# Patient Record
Sex: Female | Born: 1970 | Race: White | Hispanic: No | Smoking: Former smoker
Health system: Southern US, Community
[De-identification: ages and names within clinical notes are randomized; demographics above are authoritative.]

## PROBLEM LIST (undated history)

## (undated) DIAGNOSIS — C801 Malignant (primary) neoplasm, unspecified: Secondary | ICD-10-CM

## (undated) DIAGNOSIS — H409 Unspecified glaucoma: Secondary | ICD-10-CM

## (undated) DIAGNOSIS — R000 Tachycardia, unspecified: Secondary | ICD-10-CM

## (undated) DIAGNOSIS — I10 Essential (primary) hypertension: Secondary | ICD-10-CM

## (undated) DIAGNOSIS — D219 Benign neoplasm of connective and other soft tissue, unspecified: Secondary | ICD-10-CM

## (undated) HISTORY — PX: WISDOM TOOTH EXTRACTION: SHX21

## (undated) HISTORY — PX: DILATION AND CURETTAGE OF UTERUS: SHX78

---

## 1994-02-16 HISTORY — PX: CERVICAL CONIZATION W/BX: SHX1330

## 2009-03-11 ENCOUNTER — Encounter: Admission: RE | Admit: 2009-03-11 | Discharge: 2009-03-11 | Payer: Self-pay | Admitting: Neurology

## 2010-12-23 ENCOUNTER — Encounter (HOSPITAL_COMMUNITY): Payer: Self-pay | Admitting: *Deleted

## 2010-12-25 ENCOUNTER — Encounter (HOSPITAL_COMMUNITY): Payer: Self-pay | Admitting: Pharmacist

## 2010-12-28 NOTE — H&P (Addendum)
40 yo G1P0 presents for hysteroscopy with IUD removal  PMHx:  Migraine HA PSHx:  EAB x 1, CKC x 1 SHx:  + tobacco, + etoh, no IVDU Meds:  topamax All:  Demerol FHx:  Arthritis, asthma, heart dz  AF, VSS Gen - NAD Abd - soft, NT PV - uterus mobile, NT.  No IUD string noted at cervical os  A/P:  Retained IUD Plan for hysteroscopy w/ removal of IUD R/B/A of procedure d/w pt & informed consent obtained.  Pt re-evaluated and H&P unchanged.  ga

## 2011-01-01 MED ORDER — DEXTROSE 5 % IV SOLN
1.0000 g | INTRAVENOUS | Status: AC
  Administered 2011-01-02: 1 g via INTRAVENOUS
  Filled 2011-01-01: qty 1

## 2011-01-02 ENCOUNTER — Encounter (HOSPITAL_COMMUNITY)
Admission: RE | Disposition: A | Discharge status: Home / Self Care | Payer: Self-pay | Source: Ambulatory Visit | Attending: Obstetrics and Gynecology

## 2011-01-02 ENCOUNTER — Ambulatory Visit (HOSPITAL_COMMUNITY)
Admission: RE | Admit: 2011-01-02 | Discharge: 2011-01-02 | Disposition: A | Discharge status: Home / Self Care | Payer: BC Managed Care – PPO | Source: Ambulatory Visit | Attending: Obstetrics and Gynecology | Admitting: Obstetrics and Gynecology

## 2011-01-02 ENCOUNTER — Ambulatory Visit (HOSPITAL_COMMUNITY): Payer: BC Managed Care – PPO | Admitting: Anesthesiology

## 2011-01-02 ENCOUNTER — Encounter (HOSPITAL_COMMUNITY): Payer: Self-pay | Admitting: *Deleted

## 2011-01-02 ENCOUNTER — Encounter (HOSPITAL_COMMUNITY): Payer: Self-pay | Admitting: Anesthesiology

## 2011-01-02 ENCOUNTER — Other Ambulatory Visit: Payer: Self-pay | Admitting: Obstetrics and Gynecology

## 2011-01-02 DIAGNOSIS — D250 Submucous leiomyoma of uterus: Secondary | ICD-10-CM | POA: Insufficient documentation

## 2011-01-02 DIAGNOSIS — Z30432 Encounter for removal of intrauterine contraceptive device: Secondary | ICD-10-CM | POA: Insufficient documentation

## 2011-01-02 HISTORY — DX: Tachycardia, unspecified: R00.0

## 2011-01-02 HISTORY — PX: IUD REMOVAL: SHX5392

## 2011-01-02 LAB — CBC
HCT: 38.6 % (ref 36.0–46.0)
MCHC: 32.9 g/dL (ref 30.0–36.0)
MCV: 99 fL (ref 78.0–100.0)
RDW: 13.6 % (ref 11.5–15.5)

## 2011-01-02 SURGERY — REMOVAL, INTRAUTERINE DEVICE
Anesthesia: General | Site: Vagina | Wound class: Clean Contaminated

## 2011-01-02 MED ORDER — MIDAZOLAM HCL 5 MG/5ML IJ SOLN
INTRAMUSCULAR | Status: DC | PRN
  Administered 2011-01-02: 2 mg via INTRAVENOUS

## 2011-01-02 MED ORDER — LIDOCAINE HCL 1 % IJ SOLN
INTRAMUSCULAR | Status: DC | PRN
  Administered 2011-01-02: 10 mL

## 2011-01-02 MED ORDER — MIDAZOLAM HCL 2 MG/2ML IJ SOLN
INTRAMUSCULAR | Status: AC
  Filled 2011-01-02: qty 2

## 2011-01-02 MED ORDER — KETOROLAC TROMETHAMINE 60 MG/2ML IM SOLN
INTRAMUSCULAR | Status: DC | PRN
  Administered 2011-01-02: 30 mg via INTRAMUSCULAR

## 2011-01-02 MED ORDER — ONDANSETRON HCL 4 MG/2ML IJ SOLN
INTRAMUSCULAR | Status: DC | PRN
  Administered 2011-01-02: 4 mg via INTRAVENOUS

## 2011-01-02 MED ORDER — LIDOCAINE HCL (CARDIAC) 20 MG/ML IV SOLN
INTRAVENOUS | Status: DC | PRN
  Administered 2011-01-02: 80 mg via INTRAVENOUS

## 2011-01-02 MED ORDER — FENTANYL CITRATE 0.05 MG/ML IJ SOLN
INTRAMUSCULAR | Status: AC
  Filled 2011-01-02: qty 2

## 2011-01-02 MED ORDER — FENTANYL CITRATE 0.05 MG/ML IJ SOLN
INTRAMUSCULAR | Status: AC
  Administered 2011-01-02: 50 ug via INTRAVENOUS
  Filled 2011-01-02: qty 2

## 2011-01-02 MED ORDER — KETOROLAC TROMETHAMINE 60 MG/2ML IM SOLN
INTRAMUSCULAR | Status: AC
  Filled 2011-01-02: qty 2

## 2011-01-02 MED ORDER — GLYCOPYRROLATE 0.2 MG/ML IJ SOLN
INTRAMUSCULAR | Status: AC
  Filled 2011-01-02: qty 1

## 2011-01-02 MED ORDER — FENTANYL CITRATE 0.05 MG/ML IJ SOLN
INTRAMUSCULAR | Status: DC | PRN
  Administered 2011-01-02: 50 ug via INTRAVENOUS
  Administered 2011-01-02: 200 ug via INTRAVENOUS
  Administered 2011-01-02: 50 ug via INTRAVENOUS
  Administered 2011-01-02: 100 ug via INTRAVENOUS

## 2011-01-02 MED ORDER — PROPOFOL 10 MG/ML IV EMUL
INTRAVENOUS | Status: AC
  Filled 2011-01-02: qty 20

## 2011-01-02 MED ORDER — FENTANYL CITRATE 0.05 MG/ML IJ SOLN
25.0000 ug | INTRAMUSCULAR | Status: DC | PRN
  Administered 2011-01-02: 50 ug via INTRAVENOUS

## 2011-01-02 MED ORDER — LACTATED RINGERS IV SOLN
INTRAVENOUS | Status: DC
  Administered 2011-01-02 (×2): via INTRAVENOUS

## 2011-01-02 MED ORDER — GLYCINE 1.5 % IR SOLN
Status: DC | PRN
  Administered 2011-01-02: 3000 mL

## 2011-01-02 MED ORDER — GLYCOPYRROLATE 0.2 MG/ML IJ SOLN
INTRAMUSCULAR | Status: DC | PRN
  Administered 2011-01-02: 0.1 mg via INTRAVENOUS

## 2011-01-02 MED ORDER — DEXAMETHASONE SODIUM PHOSPHATE 10 MG/ML IJ SOLN
INTRAMUSCULAR | Status: AC
  Filled 2011-01-02: qty 1

## 2011-01-02 MED ORDER — ONDANSETRON HCL 4 MG/2ML IJ SOLN
INTRAMUSCULAR | Status: AC
  Filled 2011-01-02: qty 2

## 2011-01-02 MED ORDER — KETOROLAC TROMETHAMINE 30 MG/ML IJ SOLN
INTRAMUSCULAR | Status: DC | PRN
  Administered 2011-01-02: 30 mg via INTRAVENOUS

## 2011-01-02 MED ORDER — DEXAMETHASONE SODIUM PHOSPHATE 4 MG/ML IJ SOLN
INTRAMUSCULAR | Status: DC | PRN
  Administered 2011-01-02: 10 mg via INTRAVENOUS

## 2011-01-02 MED ORDER — LIDOCAINE HCL (CARDIAC) 20 MG/ML IV SOLN
INTRAVENOUS | Status: AC
  Filled 2011-01-02: qty 5

## 2011-01-02 MED ORDER — HYDROCODONE-ACETAMINOPHEN 5-500 MG PO TABS: 1.0000 | ORAL_TABLET | Freq: Four times a day (QID) | ORAL | Status: AC | PRN

## 2011-01-02 SURGICAL SUPPLY — 18 items
ABLATOR ENDOMETRIAL BIPOLAR (ABLATOR) IMPLANT
CANISTER SUCTION 2500CC (MISCELLANEOUS) ×3 IMPLANT
CATH ROBINSON RED A/P 16FR (CATHETERS) ×3 IMPLANT
CATH THERMACHOICE III (CATHETERS) IMPLANT
CLOTH BEACON ORANGE TIMEOUT ST (SAFETY) ×3 IMPLANT
CONTAINER PREFILL 10% NBF 60ML (FORM) ×6 IMPLANT
DRAPE UTILITY XL STRL (DRAPES) ×3 IMPLANT
ELECT REM PT RETURN 9FT ADLT (ELECTROSURGICAL) ×3
ELECTRODE REM PT RTRN 9FT ADLT (ELECTROSURGICAL) ×2 IMPLANT
GLOVE BIO SURGEON STRL SZ 6.5 (GLOVE) ×3 IMPLANT
GLOVE BIOGEL PI IND STRL 7.0 (GLOVE) ×2 IMPLANT
GLOVE BIOGEL PI INDICATOR 7.0 (GLOVE) ×1
GOWN PREVENTION PLUS LG XLONG (DISPOSABLE) ×3 IMPLANT
GOWN STRL REIN XL XLG (GOWN DISPOSABLE) ×3 IMPLANT
LOOP ANGLED CUTTING 22FR (CUTTING LOOP) IMPLANT
PACK HYSTEROSCOPY LF (CUSTOM PROCEDURE TRAY) ×3 IMPLANT
TOWEL OR 17X24 6PK STRL BLUE (TOWEL DISPOSABLE) ×6 IMPLANT
WATER STERILE IRR 1000ML POUR (IV SOLUTION) ×3 IMPLANT

## 2011-01-02 NOTE — Op Note (Signed)
Rachel Davies, Rachel Davies                ACCOUNT NO.:  [id number]  MEDICAL RECORD NO.:  [id number]  LOCATION:  [facility]                          FACILITY[facility]  [facility]  PHYSICIAN:  Zelphia Cairo, MD    DATE OF BIRTH:  1971-01-29  DATE OF PROCEDURE:  01/02/2011 DATE OF DISCHARGE:                              OPERATIVE REPORT   PREOPERATIVE DIAGNOSIS:  Retained intrauterine device.  POSTOPERATIVE DIAGNOSES: 1. Retained intrauterine device. 2. Submucosal fibroid.  PROCEDURE: 1. Hysteroscopy. 2. Removal of Mirena intrauterine device. 3. Partial resection of submucous fibroid. 4. Cervical block.  SURGEON:  Zelphia Cairo, MD  ANESTHESIA:  General.  FLUID DEFICIT:  300 mL, glycine.  BLOOD LOSS:  25 mL.  COMPLICATIONS:  None.  CONDITION:  Stable to recovery room.  PROCEDURE:  [name] was taken to the recovery room.  After informed consent was obtained, she was given general anesthesia and placed in the dorsal lithotomy position using Allen stirrups.  She was prepped and draped in sterile fashion.  An in and out catheter was used to drain her bladder. 8 mL of 1% lidocaine was used to provide a cervical block and 1 mL was injected at 12 o'clock of the cervix.  A single-tooth tenaculum was attached to the anterior lip of the cervix.  The cervix was then serially dilated using Pratt dilators.  The diagnostic hysteroscope was then inserted through the cervical canal and the IUD was identified in the uterine cavity.  The string was noted to be tucked behind a submucous fibroid.  The bottom of the IUD was grasped with blunt graspers with hysteroscopic guidance and removed without difficulty. The resectoscope was then inserted into the endometrial cavity, and the submucous fibroid was partially resected.  Because our fluid deficit increased to 300 mL, I was uncomfortable continuing with resection because of concern for extravasation.  Myoma graspers were used and inserted into the endometrial cavity  and fragments of the myoma were grasped and removed.  All instruments were removed from the uterus and cervix. She had a small laceration from the tenaculum site, which was repaired with a figure-of-eight stitch using 0 Vicryl.  Hemostasis of the cervix was noted.  Speculum was removed.  The patient was extubated and taken to the recovery room in stable condition.  Sponge, lap, needle, and instrument counts were correct x2.     Zelphia Cairo, MD     GA/MEDQ  D:  01/02/2011  T:  01/02/2011  Job:  [id number]

## 2011-01-02 NOTE — Anesthesia Procedure Notes (Signed)
Procedure Name: LMA Insertion Date/Time: 01/02/2011 7:33 AM Performed by: Karleen Dolphin Pre-anesthesia Checklist: Patient identified, Patient being monitored, Emergency Drugs available, Timeout performed and Suction available Patient Re-evaluated:Patient Re-evaluated prior to inductionOxygen Delivery Method: Circle System Utilized Preoxygenation: Pre-oxygenation with 100% oxygen Intubation Type: IV induction LMA: LMA inserted LMA Size: 4.0 Number of attempts: 1 Placement Confirmation: positive ETCO2 and breath sounds checked- equal and bilateral Tube secured with: Tape Dental Injury: Teeth and Oropharynx as per pre-operative assessment

## 2011-01-02 NOTE — Transfer of Care (Signed)
Immediate Anesthesia Transfer of Care Note  Patient: Rachel Davies  Procedure(s) Performed:  INTRAUTERINE DEVICE (IUD) REMOVAL; DILATATION & CURETTAGE/HYSTEROSCOPY WITH RESECTOSCOPE  Patient Location: PACU  Anesthesia Type: General  Level of Consciousness: awake, alert  and oriented  Airway & Oxygen Therapy: Patient Spontanous Breathing and Patient connected to nasal cannula oxygen  Post-op Assessment: Report given to PACU RN and Post -op Vital signs reviewed and stable  Post vital signs: Reviewed and stable  Complications: No apparent anesthesia complications

## 2011-01-02 NOTE — Anesthesia Preprocedure Evaluation (Signed)
Anesthesia Evaluation  Patient identified by MRN, date of birth, ID band  Reviewed: Allergy & Precautions, H&P , NPO status , Patient's Chart, lab work & pertinent test results  Airway Mallampati: II TM Distance: >3 FB Neck ROM: full    Dental No notable dental hx. (+) Teeth Intact   Pulmonary neg pulmonary ROS,    Pulmonary exam normal       Cardiovascular neg cardio ROS     Neuro/Psych Negative Psych ROS   GI/Hepatic negative GI ROS, Neg liver ROS,   Endo/Other  Negative Endocrine ROS  Renal/GU negative Renal ROS  Genitourinary negative   Musculoskeletal negative musculoskeletal ROS (+)   Abdominal Normal abdominal exam  (+)   Peds negative pediatric ROS (+)  Hematology negative hematology ROS (+)   Anesthesia Other Findings   Reproductive/Obstetrics negative OB ROS                           Anesthesia Physical Anesthesia Plan  ASA: II  Anesthesia Plan: General   Post-op Pain Management:    Induction: Intravenous  Airway Management Planned: LMA  Additional Equipment:   Intra-op Plan:   Post-operative Plan:   Informed Consent: I have reviewed the patients History and Physical, chart, labs and discussed the procedure including the risks, benefits and alternatives for the proposed anesthesia with the patient or authorized representative who has indicated his/her understanding and acceptance.     Plan Discussed with: CRNA  Anesthesia Plan Comments:         Anesthesia Quick Evaluation

## 2011-01-05 ENCOUNTER — Encounter (HOSPITAL_COMMUNITY): Payer: Self-pay | Admitting: Obstetrics and Gynecology

## 2011-01-05 NOTE — Anesthesia Postprocedure Evaluation (Signed)
Anesthesia Post Note  Patient: Jamiracle Lonia Farber  Procedure(s) Performed:  INTRAUTERINE DEVICE (IUD) REMOVAL; DILATATION & CURETTAGE/HYSTEROSCOPY WITH RESECTOSCOPE  Anesthesia type: General  Patient location: PACU  Post pain: Pain level controlled  Post assessment: Post-op Vital signs reviewed  Last Vitals:  Filed Vitals:   01/02/11 0945  BP:   Pulse: 70  Temp:   Resp:     Post vital signs: Reviewed  Level of consciousness: sedated  Complications: No apparent anesthesia complications

## 2011-04-13 ENCOUNTER — Other Ambulatory Visit: Payer: Self-pay | Admitting: Obstetrics and Gynecology

## 2011-04-14 ENCOUNTER — Encounter (HOSPITAL_COMMUNITY): Payer: Self-pay | Admitting: *Deleted

## 2011-05-11 NOTE — H&P (Addendum)
41 yo with submucus fibroid and menorrhagia presents for surgical mngt.    PMHx:  H/o abnormal paps, fibroids PSHx:  Hysteroscopy, cervical biopsy All:  Demerol Meds:  Flexeril prn SHx:  No tobacco  AF, VSS Gen - NAD Abd - soft, NT CV - RRR Lungs - clear bil PV - uterus mobile, NT  [place] - submucus fibroid  A/P:  Menorrhagia, fibroids Hysteroscopy, D&C w/ removal of submucus fibroid  Plan of care d/w patient, r/b/a discussed.  Informed consent obtained

## 2011-05-13 MED ORDER — DEXTROSE 5 % IV SOLN
2.0000 g | INTRAVENOUS | Status: AC
  Administered 2011-05-14: 2 g via INTRAVENOUS
  Filled 2011-05-13: qty 2

## 2011-05-14 ENCOUNTER — Encounter (HOSPITAL_COMMUNITY)
Admission: RE | Disposition: A | Discharge status: Home Health | Payer: Self-pay | Source: Ambulatory Visit | Attending: Obstetrics and Gynecology

## 2011-05-14 ENCOUNTER — Encounter (HOSPITAL_COMMUNITY): Payer: Self-pay | Admitting: Anesthesiology

## 2011-05-14 ENCOUNTER — Ambulatory Visit (HOSPITAL_COMMUNITY)
Admission: RE | Admit: 2011-05-14 | Discharge: 2011-05-14 | Disposition: A | Discharge status: Home Health | Payer: BC Managed Care – PPO | Source: Ambulatory Visit | Attending: Obstetrics and Gynecology | Admitting: Obstetrics and Gynecology

## 2011-05-14 ENCOUNTER — Encounter (HOSPITAL_COMMUNITY): Payer: Self-pay | Admitting: *Deleted

## 2011-05-14 ENCOUNTER — Ambulatory Visit (HOSPITAL_COMMUNITY): Payer: BC Managed Care – PPO | Admitting: Anesthesiology

## 2011-05-14 DIAGNOSIS — D250 Submucous leiomyoma of uterus: Secondary | ICD-10-CM | POA: Insufficient documentation

## 2011-05-14 DIAGNOSIS — N920 Excessive and frequent menstruation with regular cycle: Secondary | ICD-10-CM | POA: Insufficient documentation

## 2011-05-14 LAB — CBC
Hemoglobin: 12.7 g/dL (ref 12.0–15.0)
MCHC: 32.9 g/dL (ref 30.0–36.0)
Platelets: 288 10*3/uL (ref 150–400)

## 2011-05-14 LAB — PREGNANCY, URINE: Preg Test, Ur: NEGATIVE

## 2011-05-14 SURGERY — DILATATION & CURETTAGE/HYSTEROSCOPY WITH VERSAPOINT RESECTION
Anesthesia: General | Site: Vagina | Wound class: Clean Contaminated

## 2011-05-14 MED ORDER — FENTANYL CITRATE 0.05 MG/ML IJ SOLN
25.0000 ug | INTRAMUSCULAR | Status: DC | PRN
  Administered 2011-05-14: 50 ug via INTRAVENOUS

## 2011-05-14 MED ORDER — OXYCODONE-ACETAMINOPHEN 5-325 MG PO TABS
ORAL_TABLET | ORAL | Status: AC
  Filled 2011-05-14: qty 1

## 2011-05-14 MED ORDER — DEXAMETHASONE SODIUM PHOSPHATE 4 MG/ML IJ SOLN
INTRAMUSCULAR | Status: DC | PRN
  Administered 2011-05-14: 10 mg via INTRAVENOUS

## 2011-05-14 MED ORDER — PROPOFOL 10 MG/ML IV EMUL
INTRAVENOUS | Status: DC | PRN
  Administered 2011-05-14: 200 mg via INTRAVENOUS

## 2011-05-14 MED ORDER — OXYCODONE-ACETAMINOPHEN 5-325 MG PO TABS
1.0000 | ORAL_TABLET | ORAL | Status: DC | PRN
  Administered 2011-05-14: 1 via ORAL

## 2011-05-14 MED ORDER — MIDAZOLAM HCL 2 MG/2ML IJ SOLN
INTRAMUSCULAR | Status: AC
  Filled 2011-05-14: qty 2

## 2011-05-14 MED ORDER — PROPOFOL 10 MG/ML IV EMUL
INTRAVENOUS | Status: AC
  Filled 2011-05-14: qty 20

## 2011-05-14 MED ORDER — KETOROLAC TROMETHAMINE 30 MG/ML IJ SOLN
INTRAMUSCULAR | Status: DC | PRN
  Administered 2011-05-14: 60 mg via INTRAVENOUS

## 2011-05-14 MED ORDER — ACETAMINOPHEN 325 MG PO TABS: 325.0000 mg | ORAL_TABLET | ORAL | Status: DC | PRN

## 2011-05-14 MED ORDER — LIDOCAINE HCL 1 % IJ SOLN
INTRAMUSCULAR | Status: DC | PRN
  Administered 2011-05-14: 10 mL

## 2011-05-14 MED ORDER — LIDOCAINE HCL (CARDIAC) 20 MG/ML IV SOLN
INTRAVENOUS | Status: AC
  Filled 2011-05-14: qty 5

## 2011-05-14 MED ORDER — DEXAMETHASONE SODIUM PHOSPHATE 10 MG/ML IJ SOLN
INTRAMUSCULAR | Status: AC
  Filled 2011-05-14: qty 1

## 2011-05-14 MED ORDER — FENTANYL CITRATE 0.05 MG/ML IJ SOLN
INTRAMUSCULAR | Status: DC | PRN
  Administered 2011-05-14 (×4): 50 ug via INTRAVENOUS

## 2011-05-14 MED ORDER — KETOROLAC TROMETHAMINE 30 MG/ML IJ SOLN
INTRAMUSCULAR | Status: AC
  Filled 2011-05-14: qty 1

## 2011-05-14 MED ORDER — FENTANYL CITRATE 0.05 MG/ML IJ SOLN
INTRAMUSCULAR | Status: AC
  Administered 2011-05-14: 50 ug via INTRAVENOUS
  Filled 2011-05-14: qty 2

## 2011-05-14 MED ORDER — ONDANSETRON HCL 4 MG/2ML IJ SOLN
INTRAMUSCULAR | Status: AC
  Filled 2011-05-14: qty 2

## 2011-05-14 MED ORDER — LIDOCAINE HCL (CARDIAC) 20 MG/ML IV SOLN
INTRAVENOUS | Status: DC | PRN
  Administered 2011-05-14: 100 mg via INTRAVENOUS

## 2011-05-14 MED ORDER — LACTATED RINGERS IV SOLN
INTRAVENOUS | Status: DC
Start: 2011-05-14 — End: 2011-05-14
  Administered 2011-05-14 (×3): via INTRAVENOUS

## 2011-05-14 MED ORDER — KETOROLAC TROMETHAMINE 30 MG/ML IJ SOLN: 15.0000 mg | Freq: Once | INTRAMUSCULAR | Status: DC | PRN

## 2011-05-14 MED ORDER — OXYCODONE-ACETAMINOPHEN 10-325 MG PO TABS: 1.0000 | ORAL_TABLET | ORAL | Status: AC | PRN

## 2011-05-14 MED ORDER — MIDAZOLAM HCL 5 MG/5ML IJ SOLN
INTRAMUSCULAR | Status: DC | PRN
  Administered 2011-05-14: 2 mg via INTRAVENOUS

## 2011-05-14 MED ORDER — MIDAZOLAM HCL 2 MG/2ML IJ SOLN: 0.5000 mg | Freq: Once | INTRAMUSCULAR | Status: DC | PRN

## 2011-05-14 MED ORDER — PROMETHAZINE HCL 25 MG/ML IJ SOLN: 6.2500 mg | INTRAMUSCULAR | Status: DC | PRN

## 2011-05-14 MED ORDER — ONDANSETRON HCL 4 MG/2ML IJ SOLN
INTRAMUSCULAR | Status: DC | PRN
  Administered 2011-05-14: 4 mg via INTRAVENOUS

## 2011-05-14 MED ORDER — FENTANYL CITRATE 0.05 MG/ML IJ SOLN
INTRAMUSCULAR | Status: AC
  Filled 2011-05-14: qty 4

## 2011-05-14 MED ORDER — SODIUM CHLORIDE 0.9 % IR SOLN
Status: DC | PRN
  Administered 2011-05-14: 3000 mL

## 2011-05-14 MED ORDER — KETOROLAC TROMETHAMINE 60 MG/2ML IM SOLN
INTRAMUSCULAR | Status: DC | PRN
  Administered 2011-05-14: 30 mg via INTRAMUSCULAR

## 2011-05-14 SURGICAL SUPPLY — 19 items
CANISTER SUCTION 2500CC (MISCELLANEOUS) ×2 IMPLANT
CATH ROBINSON RED A/P 16FR (CATHETERS) ×2 IMPLANT
CLOTH BEACON ORANGE TIMEOUT ST (SAFETY) ×2 IMPLANT
CONTAINER PREFILL 10% NBF 60ML (FORM) ×2 IMPLANT
ELECT REM PT RETURN 9FT ADLT (ELECTROSURGICAL)
ELECTRODE REM PT RTRN 9FT ADLT (ELECTROSURGICAL) IMPLANT
ELECTRODE RT ANGLE VERSAPOINT (CUTTING LOOP) ×2 IMPLANT
GAUZE SPONGE 4X4 16PLY XRAY LF (GAUZE/BANDAGES/DRESSINGS) ×4 IMPLANT
GLOVE BIO SURGEON STRL SZ 6.5 (GLOVE) ×4 IMPLANT
GLOVE BIOGEL PI IND STRL 6 (GLOVE) ×2 IMPLANT
GLOVE BIOGEL PI INDICATOR 6 (GLOVE) ×2
GLOVE SURG SS PI 6.0 STRL IVOR (GLOVE) ×4 IMPLANT
GOWN PREVENTION PLUS LG XLONG (DISPOSABLE) ×2 IMPLANT
GOWN STRL REIN XL XLG (GOWN DISPOSABLE) ×2 IMPLANT
LOOP ANGLED CUTTING 22FR (CUTTING LOOP) IMPLANT
NEEDLE SPNL 20GX3.5 QUINCKE YW (NEEDLE) IMPLANT
PACK HYSTEROSCOPY LF (CUSTOM PROCEDURE TRAY) ×2 IMPLANT
TOWEL OR 17X24 6PK STRL BLUE (TOWEL DISPOSABLE) ×4 IMPLANT
WATER STERILE IRR 1000ML POUR (IV SOLUTION) IMPLANT

## 2011-05-14 NOTE — Anesthesia Preprocedure Evaluation (Addendum)
Anesthesia Evaluation  Patient identified by MRN, date of birth, ID band Patient awake    Reviewed: Allergy & Precautions, H&P , NPO status , Patient's Chart, lab work & pertinent test results, reviewed documented beta blocker date and time   History of Anesthesia Complications Negative for: history of anesthetic complications  Airway Mallampati: II TM Distance: >3 FB Neck ROM: full    Dental No notable dental hx.    Pulmonary neg pulmonary ROS,  breath sounds clear to auscultation  Pulmonary exam normal       Cardiovascular Exercise Tolerance: Good negative cardio ROS  + dysrhythmias Rhythm:regular Rate:Normal     Neuro/Psych  Headaches, negative neurological ROS  negative psych ROS   GI/Hepatic negative GI ROS, Neg liver ROS,   Endo/Other  negative endocrine ROS  Renal/GU negative Renal ROS     Musculoskeletal   Abdominal   Peds  Hematology negative hematology ROS (+)   Anesthesia Other Findings   Reproductive/Obstetrics negative OB ROS                          Anesthesia Physical Anesthesia Plan  ASA: II  Anesthesia Plan: General LMA   Post-op Pain Management:    Induction:   Airway Management Planned:   Additional Equipment:   Intra-op Plan:   Post-operative Plan:   Informed Consent: I have reviewed the patients History and Physical, chart, labs and discussed the procedure including the risks, benefits and alternatives for the proposed anesthesia with the patient or authorized representative who has indicated his/her understanding and acceptance.   Dental Advisory Given  Plan Discussed with: CRNA and Surgeon  Anesthesia Plan Comments:        Anesthesia Quick Evaluation

## 2011-05-14 NOTE — Addendum Note (Signed)
Addendum  created 05/14/11 0944 by Adonia Porada J Autumn Pruitt, CRNA   Modules edited:Anesthesia Flowsheet    

## 2011-05-14 NOTE — Preoperative (Signed)
Beta Blockers   Reason not to administer Beta Blockers:Not Applicable 

## 2011-05-14 NOTE — Anesthesia Postprocedure Evaluation (Signed)
  Anesthesia Post-op Note  Patient: Rachel Davies  Procedure(s) Performed: Procedure(s) (LRB): DILATATION & CURETTAGE/HYSTEROSCOPY WITH VERSAPOINT RESECTION (N/A)  Patient Location: PACU  Anesthesia Type: General  Level of Consciousness: awake, alert  and oriented  Airway and Oxygen Therapy: Patient Spontanous Breathing  Post-op Pain: none  Post-op Assessment: Post-op Vital signs reviewed, Patient's Cardiovascular Status Stable, Respiratory Function Stable, Patent Airway, No signs of Nausea or vomiting and Pain level controlled   Post-op Vital Signs: Reviewed and stable  Complications: No apparent anesthesia complications

## 2011-05-14 NOTE — Op Note (Signed)
NAMECHARLESTON, VIERLING                ACCOUNT NO.:  [id number]  MEDICAL RECORD NO.:  [id number]  LOCATION:  [facility]                          [facility]  [facility]  PHYSICIAN:  Zelphia Cairo, MD    DATE OF BIRTH:  24-Feb-1970  DATE OF PROCEDURE:  05/14/2011 DATE OF DISCHARGE:                              OPERATIVE REPORT   PREOPERATIVE DIAGNOSES: 1. Menorrhagia. 2. Submucous fibroid.  POSTOPERATIVE DIAGNOSES: 1. Menorrhagia. 2. Submucous fibroid. 3. Path pending.  PROCEDURES:  Hysteroscopy, D and C, with resection of submucous fibroid, cervical block.  SURGEON:  Zelphia Cairo, MD.  ANESTHESIA:  General.  COMPLICATIONS:  None.  FLUID DEFICIT:  100 mL.  BLOOD LOSS:  Minimal.  CONDITION:  Stable to recovery room.  PROCEDURE IN DETAIL:  The patient was taken to the operating room after informed consent was obtained.  She was given general anesthesia and placed in the dorsal lithotomy position using Allen stirrups.  She was prepped and draped in sterile fashion.  An in and out catheter was used to drain her bladder.  Bivalve speculum was placed in the vagina and a single-tooth tenaculum was placed on the anterior lip of the cervix, 9 mL of 1% lidocaine was used to perform a cervical block.  The cervix was then serially dilated using Pratt dilators, and the resectoscope was inserted into the endometrial cavity.  Large submucous fibroid was identified.  The resectoscope was used to shave submucous fibroid until it was flush with the uterine wall.  The pieces of fibroid were then removed with a gentle curetting.  Tenaculum and speculum were then removed.  The cervix was hemostatic.  The patient was extubated and taken to the recovery room in stable condition.  Sponge, lap, needle, and instrument counts were correct x2.     Zelphia Cairo, MD     GA/MEDQ  D:  05/14/2011  T:  05/14/2011  Job:  [id number]

## 2011-05-14 NOTE — Addendum Note (Signed)
Addendum  created 05/14/11 0944 by Collier Flowers, CRNA   Modules edited:Anesthesia Flowsheet

## 2011-05-14 NOTE — Transfer of Care (Signed)
Immediate Anesthesia Transfer of Care Note  Patient: Rachel Davies  Procedure(s) Performed: Procedure(s) (LRB): DILATATION & CURETTAGE/HYSTEROSCOPY WITH VERSAPOINT RESECTION (N/A)  Patient Location: PACU  Anesthesia Type: General  Level of Consciousness: awake, alert , oriented and patient cooperative  Airway & Oxygen Therapy: Patient Spontanous Breathing and Patient connected to nasal cannula oxygen  Post-op Assessment: Report given to PACU RN and Post -op Vital signs reviewed and stable  Post vital signs: Reviewed and stable  Complications: No apparent anesthesia complications

## 2011-05-14 NOTE — Discharge Instructions (Signed)
[facility] office 2-3 weeks for postop appointment.  Call the office [phone] for an appointment.  Personal Hygiene: Use pads not tampons x 1week You may shower, no tub baths or pools for 2-3 weeks Wipe from front to back when using restroom  Activity: Do not drive or operate any equipment for 24 hrs.   Do not rest in bed all day Walking is encouraged Walk up and down stairs slowly You may return to your normal activity in 1-2 days  Sexual Activity:  No intercourse for 2 weeks after the procedure.  Diet: Eat a light meal as desired this evening.  You may resume your usual diet tomorrow.  Return to work:  You may resume your work activities after 1-2 days  What to expect:  Expect to have vaginal bleeding/discharge for 2-3 days and spotting for 10-14 days.  It is not unusual to have soreness for 1-2 weeks.  You may have a slight burning sensation when you urinate for the first few days.  You may start your menses in 2-6 weeks.  Mild cramps may continue for a couple of days.    Call your doctor:   Excessive bleeding, saturating a pad every hour Inability to urinate 6 hours after discharge Pain not relieved with pain medications Fever of 100.4 or greater DISCHARGE INSTRUCTIONS: HYSTEROSCOPY / ENDOMETRIAL ABLATION The following instructions have been prepared to help you care for yourself upon your return home.  Personal hygiene: . Use sanitary pads for vaginal drainage, not tampons. . Shower the day after your procedure. . NO tub baths, pools or Jacuzzis for 2-3 weeks. . Wipe front to back after using the bathroom.  Activity and limitations: . Do NOT drive or operate any equipment for 24 hours. The effects of anesthesia are still present and drowsiness may result. . Do NOT rest in bed all day. . Walking is encouraged. . Walk up and down stairs slowly. . You may resume your normal activity in one to two days or as indicated by your physician. Sexual activity: NO intercourse for at least  2 weeks after the procedure, or as indicated by your Doctor.  Diet: Eat a light meal as desired this evening. You may resume your usual diet tomorrow.  Return to Work: You may resume your work activities in one to two days or as indicated by your Doctor.  What to expect after your surgery: Expect to have vaginal bleeding/discharge for 2-3 days and spotting for up to 10 days. It is not unusual to have soreness for up to 1-2 weeks. You may have a slight burning sensation when you urinate for the first day. Mild cramps may continue for a couple of days. You may have a regular period in 2-6 weeks.  Call your doctor for any of the following: . Excessive vaginal bleeding or clotting, saturating and changing one pad every hour. . Inability to urinate 6 hours after discharge from hospital. . Pain not relieved by pain medication. . Fever of 100.4 F or greater. . Unusual vaginal discharge or odor.  Return to office _________________Call for an appointment ___________________ Patient's signature: ______________________ Nurse's signature ________________________  Post Anesthesia Care Unit [phone]  

## 2011-11-26 ENCOUNTER — Other Ambulatory Visit (HOSPITAL_COMMUNITY): Payer: Self-pay | Admitting: Gynecology

## 2011-11-26 DIAGNOSIS — Z3141 Encounter for fertility testing: Secondary | ICD-10-CM

## 2011-12-08 ENCOUNTER — Ambulatory Visit (HOSPITAL_COMMUNITY)
Admission: RE | Admit: 2011-12-08 | Discharge: 2011-12-08 | Disposition: A | Discharge status: Home / Self Care | Payer: BC Managed Care – PPO | Source: Ambulatory Visit | Attending: Gynecology | Admitting: Gynecology

## 2011-12-08 DIAGNOSIS — N979 Female infertility, unspecified: Secondary | ICD-10-CM | POA: Insufficient documentation

## 2011-12-08 DIAGNOSIS — Z3141 Encounter for fertility testing: Secondary | ICD-10-CM

## 2011-12-08 MED ORDER — IOHEXOL 300 MG/ML  SOLN: 10.0000 mL | Freq: Once | INTRAMUSCULAR | Status: AC | PRN

## 2011-12-16 ENCOUNTER — Encounter (HOSPITAL_COMMUNITY): Payer: Self-pay | Admitting: Pharmacist

## 2011-12-30 ENCOUNTER — Ambulatory Visit (HOSPITAL_COMMUNITY)
Admission: RE | Admit: 2011-12-30 | Discharge: 2011-12-30 | Disposition: A | Discharge status: Home / Self Care | Payer: BC Managed Care – PPO | Source: Ambulatory Visit | Attending: Obstetrics and Gynecology | Admitting: Obstetrics and Gynecology

## 2011-12-30 ENCOUNTER — Encounter (HOSPITAL_COMMUNITY): Payer: Self-pay | Admitting: *Deleted

## 2011-12-30 ENCOUNTER — Encounter (HOSPITAL_COMMUNITY): Payer: Self-pay | Admitting: Anesthesiology

## 2011-12-30 ENCOUNTER — Encounter (HOSPITAL_COMMUNITY)
Admission: RE | Disposition: A | Discharge status: Home / Self Care | Payer: Self-pay | Source: Ambulatory Visit | Attending: Obstetrics and Gynecology

## 2011-12-30 ENCOUNTER — Ambulatory Visit (HOSPITAL_COMMUNITY): Payer: BC Managed Care – PPO | Admitting: Anesthesiology

## 2011-12-30 DIAGNOSIS — N979 Female infertility, unspecified: Secondary | ICD-10-CM | POA: Insufficient documentation

## 2011-12-30 DIAGNOSIS — D250 Submucous leiomyoma of uterus: Secondary | ICD-10-CM | POA: Insufficient documentation

## 2011-12-30 HISTORY — PX: DILATATION & CURRETTAGE/HYSTEROSCOPY WITH RESECTOCOPE: SHX5572

## 2011-12-30 LAB — CBC
MCH: 32.3 pg (ref 26.0–34.0)
MCHC: 33.9 g/dL (ref 30.0–36.0)
MCV: 95.1 fL (ref 78.0–100.0)
Platelets: 321 10*3/uL (ref 150–400)
RBC: 4.09 MIL/uL (ref 3.87–5.11)

## 2011-12-30 SURGERY — DILATATION & CURETTAGE/HYSTEROSCOPY WITH RESECTOCOPE
Anesthesia: General | Site: Uterus | Wound class: Clean Contaminated

## 2011-12-30 MED ORDER — FENTANYL CITRATE 0.05 MG/ML IJ SOLN
INTRAMUSCULAR | Status: AC
  Filled 2011-12-30: qty 2

## 2011-12-30 MED ORDER — PROPOFOL 10 MG/ML IV EMUL
INTRAVENOUS | Status: AC
  Filled 2011-12-30: qty 20

## 2011-12-30 MED ORDER — MIDAZOLAM HCL 2 MG/2ML IJ SOLN: 0.5000 mg | Freq: Once | INTRAMUSCULAR | Status: DC | PRN

## 2011-12-30 MED ORDER — FENTANYL CITRATE 0.05 MG/ML IJ SOLN
INTRAMUSCULAR | Status: DC | PRN
  Administered 2011-12-30 (×6): 50 ug via INTRAVENOUS

## 2011-12-30 MED ORDER — BUPIVACAINE HCL (PF) 0.5 % IJ SOLN
INTRAMUSCULAR | Status: DC | PRN
  Administered 2011-12-30: 30 mL

## 2011-12-30 MED ORDER — HYDROCODONE-ACETAMINOPHEN 5-325 MG PO TABS
ORAL_TABLET | ORAL | Status: AC
  Filled 2011-12-30: qty 1

## 2011-12-30 MED ORDER — FENTANYL CITRATE 0.05 MG/ML IJ SOLN
INTRAMUSCULAR | Status: AC
  Administered 2011-12-30: 50 ug via INTRAVENOUS
  Filled 2011-12-30: qty 2

## 2011-12-30 MED ORDER — LACTATED RINGERS IV SOLN
INTRAVENOUS | Status: DC
  Administered 2011-12-30: 125 mL/h via INTRAVENOUS
  Administered 2011-12-30 (×2): via INTRAVENOUS

## 2011-12-30 MED ORDER — KETOROLAC TROMETHAMINE 30 MG/ML IJ SOLN: 15.0000 mg | Freq: Once | INTRAMUSCULAR | Status: DC | PRN

## 2011-12-30 MED ORDER — KETOROLAC TROMETHAMINE 30 MG/ML IJ SOLN
INTRAMUSCULAR | Status: DC | PRN
  Administered 2011-12-30: 30 mg via INTRAVENOUS

## 2011-12-30 MED ORDER — MIDAZOLAM HCL 5 MG/5ML IJ SOLN
INTRAMUSCULAR | Status: DC | PRN
  Administered 2011-12-30: 2 mg via INTRAVENOUS

## 2011-12-30 MED ORDER — MIDAZOLAM HCL 2 MG/2ML IJ SOLN
INTRAMUSCULAR | Status: AC
  Filled 2011-12-30: qty 2

## 2011-12-30 MED ORDER — LIDOCAINE HCL (CARDIAC) 20 MG/ML IV SOLN
INTRAVENOUS | Status: DC | PRN
  Administered 2011-12-30: 70 mg via INTRAVENOUS
  Administered 2011-12-30: 20 mg via INTRAVENOUS

## 2011-12-30 MED ORDER — PROPOFOL 10 MG/ML IV EMUL
INTRAVENOUS | Status: DC | PRN
  Administered 2011-12-30: 10 mg via INTRAVENOUS
  Administered 2011-12-30: 180 mg via INTRAVENOUS

## 2011-12-30 MED ORDER — ONDANSETRON HCL 4 MG/2ML IJ SOLN
INTRAMUSCULAR | Status: DC | PRN
  Administered 2011-12-30: 4 mg via INTRAVENOUS

## 2011-12-30 MED ORDER — BUPIVACAINE HCL (PF) 0.5 % IJ SOLN
INTRAMUSCULAR | Status: AC
  Filled 2011-12-30: qty 30

## 2011-12-30 MED ORDER — GLYCINE 1.5 % IR SOLN
Status: DC | PRN
  Administered 2011-12-30: 3000 mL

## 2011-12-30 MED ORDER — DEXTROSE 5 % IV SOLN
2.0000 g | INTRAVENOUS | Status: AC
  Administered 2011-12-30: 2 g via INTRAVENOUS
  Filled 2011-12-30: qty 2

## 2011-12-30 MED ORDER — FENTANYL CITRATE 0.05 MG/ML IJ SOLN
25.0000 ug | INTRAMUSCULAR | Status: DC | PRN
  Administered 2011-12-30 (×3): 50 ug via INTRAVENOUS

## 2011-12-30 MED ORDER — PROMETHAZINE HCL 25 MG/ML IJ SOLN: 6.2500 mg | INTRAMUSCULAR | Status: DC | PRN

## 2011-12-30 MED ORDER — HYDROCODONE-ACETAMINOPHEN 5-325 MG PO TABS
1.0000 | ORAL_TABLET | Freq: Once | ORAL | Status: AC
  Administered 2011-12-30: 1 via ORAL

## 2011-12-30 MED ORDER — KETOROLAC TROMETHAMINE 30 MG/ML IJ SOLN
INTRAMUSCULAR | Status: AC
  Filled 2011-12-30: qty 1

## 2011-12-30 MED ORDER — ONDANSETRON HCL 4 MG/2ML IJ SOLN
INTRAMUSCULAR | Status: AC
  Filled 2011-12-30: qty 2

## 2011-12-30 MED ORDER — HYDROCODONE-ACETAMINOPHEN 5-500 MG PO TABS: 1.0000 | ORAL_TABLET | Freq: Four times a day (QID) | ORAL | Status: DC | PRN

## 2011-12-30 MED ORDER — LIDOCAINE HCL (CARDIAC) 20 MG/ML IV SOLN
INTRAVENOUS | Status: AC
  Filled 2011-12-30: qty 5

## 2011-12-30 SURGICAL SUPPLY — 18 items
ABLATOR ENDOMETRIAL BIPOLAR (ABLATOR) IMPLANT
CANISTER SUCTION 2500CC (MISCELLANEOUS) ×2 IMPLANT
CATH ROBINSON RED A/P 16FR (CATHETERS) ×2 IMPLANT
CATH THERMACHOICE III (CATHETERS) IMPLANT
CLOTH BEACON ORANGE TIMEOUT ST (SAFETY) ×2 IMPLANT
CONTAINER PREFILL 10% NBF 60ML (FORM) ×4 IMPLANT
DRESSING TELFA 8X3 (GAUZE/BANDAGES/DRESSINGS) ×2 IMPLANT
ELECT REM PT RETURN 9FT ADLT (ELECTROSURGICAL) ×2
ELECTRODE REM PT RTRN 9FT ADLT (ELECTROSURGICAL) ×1 IMPLANT
GLOVE BIO SURGEON STRL SZ 6.5 (GLOVE) ×2 IMPLANT
GLOVE BIOGEL PI IND STRL 7.0 (GLOVE) ×1 IMPLANT
GLOVE BIOGEL PI INDICATOR 7.0 (GLOVE) ×1
GOWN STRL REIN XL XLG (GOWN DISPOSABLE) ×4 IMPLANT
LOOP ANGLED CUTTING 22FR (CUTTING LOOP) ×2 IMPLANT
PACK HYSTEROSCOPY LF (CUSTOM PROCEDURE TRAY) ×2 IMPLANT
PAD OB MATERNITY 4.3X12.25 (PERSONAL CARE ITEMS) ×2 IMPLANT
TOWEL OR 17X24 6PK STRL BLUE (TOWEL DISPOSABLE) ×4 IMPLANT
WATER STERILE IRR 1000ML POUR (IV SOLUTION) ×2 IMPLANT

## 2011-12-30 NOTE — Anesthesia Preprocedure Evaluation (Addendum)
Anesthesia Evaluation  Patient identified by MRN, date of birth, ID band Patient awake    Reviewed: Allergy & Precautions, H&P , Patient's Chart, lab work & pertinent test results, reviewed documented beta blocker date and time   History of Anesthesia Complications Negative for: history of anesthetic complications  Airway Mallampati: III TM Distance: >3 FB Neck ROM: full    Dental No notable dental hx.    Pulmonary neg pulmonary ROS,  breath sounds clear to auscultation  Pulmonary exam normal       Cardiovascular Exercise Tolerance: Good negative cardio ROS  Rhythm:regular Rate:Normal     Neuro/Psych  Headaches, negative neurological ROS  negative psych ROS   GI/Hepatic negative GI ROS, Neg liver ROS,   Endo/Other  negative endocrine ROS  Renal/GU negative Renal ROS     Musculoskeletal   Abdominal   Peds  Hematology negative hematology ROS (+)   Anesthesia Other Findings Tachyarrhythmia   h/o - no probs now- no meds Headache   migraines    Chronic kidney disease   hx kidney stone, no surgery required      Reproductive/Obstetrics negative OB ROS                          Anesthesia Physical Anesthesia Plan  ASA: II  Anesthesia Plan: General LMA   Post-op Pain Management:    Induction:   Airway Management Planned:   Additional Equipment:   Intra-op Plan:   Post-operative Plan:   Informed Consent: I have reviewed the patients History and Physical, chart, labs and discussed the procedure including the risks, benefits and alternatives for the proposed anesthesia with the patient or authorized representative who has indicated his/her understanding and acceptance.   Dental Advisory Given  Plan Discussed with: CRNA, Surgeon and Anesthesiologist  Anesthesia Plan Comments:         Anesthesia Quick Evaluation

## 2011-12-30 NOTE — Transfer of Care (Signed)
Immediate Anesthesia Transfer of Care Note  Patient: Rachel Davies  Procedure(s) Performed: Procedure(s) (LRB) with comments: DILATATION & CURETTAGE/HYSTEROSCOPY WITH RESECTOCOPE (N/A) - resection uterine of adhesions resection of submucosal fibroid  Patient Location: PACU  Anesthesia Type:General  Level of Consciousness: awake, alert , oriented and patient cooperative  Airway & Oxygen Therapy: Patient Spontanous Breathing and Patient connected to nasal cannula oxygen  Post-op Assessment: Report given to PACU RN and Post -op Vital signs reviewed and stable  Post vital signs: Reviewed and stable  Complications: No apparent anesthesia complications

## 2011-12-30 NOTE — H&P (Signed)
41 yo G0 with h/o fibroids and infertiltiy presents for surgical mngt of endometrial mass & endometial adhesion, suspect submucus fibroid.  PMHx:  Migraines, obesity PSHx:  Hysteroscopy, D&C, CKC All:  Demerol Meds:  PNV FHx:  Fibroids, HTN, DM  AF, VSS Gen - NAD ABd - soft, NT CV - RRR LUngs - clear PV - uterus mobile, NT, no adnexal mass  [place]:  Normal ovaries bilaterally, intrauterine adhesion and endometrial mass - likely submucus fibroid  A/P:  Infertility, fibroids Plan for hysteroscopy, D&C with resection of adhesion and resection of fibroid

## 2011-12-30 NOTE — Anesthesia Postprocedure Evaluation (Signed)
Anesthesia Post Note  Patient: Rachel Davies  Procedure(s) Performed: Procedure(s) (LRB): DILATATION & CURETTAGE/HYSTEROSCOPY WITH RESECTOCOPE (N/A)  Anesthesia type: GA  Patient location: PACU  Post pain: Pain level controlled  Post assessment: Post-op Vital signs reviewed  Last Vitals:  Filed Vitals:   12/30/11 1430  BP: 127/79  Pulse: 82  Temp:   Resp: 14    Post vital signs: Reviewed  Level of consciousness: sedated  Complications: No apparent anesthesia complications

## 2011-12-31 ENCOUNTER — Encounter (HOSPITAL_COMMUNITY): Payer: Self-pay | Admitting: Obstetrics and Gynecology

## 2011-12-31 NOTE — Op Note (Signed)
Rachel Davies, Rachel Davies                ACCOUNT NO.:  [id number]  MEDICAL RECORD NO.:  [id number]  LOCATION:  [facility]                          [facility]  PHYSICIAN:  Zelphia Cairo, MD    DATE OF BIRTH:  [birth year]  DATE OF PROCEDURE:  12/30/2011 DATE OF DISCHARGE:  12/30/2011                              OPERATIVE REPORT   PREOPERATIVE DIAGNOSES: 1. Infertility. 2. Fibroid uterus. 3. Endometrial adhesion.  POSTOPERATIVE DIAGNOSES: 1. Infertility. 2. Fibroid uterus. 3. Endometrial adhesion. 4. Path pending.  SURGEON:  Zelphia Cairo, MD  PROCEDURE: 1. Cervical block. 2. Hysteroscopy. 3. Resection of endometrial adhesion. 4. Resection of submucous fibroid. 5. D and C.  ANESTHESIA:  General.  COMPLICATIONS:  None.  SPECIMEN: 1. Adhesion. 2. Fibroid.  CONDITION:  Stable to recovery room.  PROCEDURE:  The patient was taken to the operating room.  After informed consent was obtained, she was placed in dorsal lithotomy position. Prepped and draped in sterile fashion.  An in and out catheter was used to drain her bladder.  Bivalve speculum was placed in the vagina.  A 1 mL of 1% lidocaine was injected into the 12 o'clock position of the cervix.  The remaining 9 mL was used to perform a cervical block. Single-tooth tenaculum was placed at the anterior lip of the cervix. The cervix was serially dilated and the diagnostic hysteroscope was inserted into the uterine cavity.  The band of adhesion was noted going from the anterior to posterior uterus.  Submucous fibroid was noted on the anterior right uterine wall.  Hysteroscopic scissors were then inserted to the hysteroscope and the band of adhesion was lysed and resected and removed.  The resectoscope was then inserted into the endometrial cavity and the cautery loop was used to shave the submucous fibroid, flushed with the uterine wall.  The hysteroscope was then removed.  Specimen was placed on Telfa and passed off to  be sent to Pathology.  The patient tolerated the procedure well. Sponge, lap, needle, and instrument counts were correct x2.  She was taken to the recovery room in stable condition.     Zelphia Cairo, MD     GA/MEDQ  D:  12/30/2011  T:  12/31/2011  Job:  [id number]

## 2012-11-23 IMAGING — RF DG HYSTEROGRAM
5 series · 5 of 5 positions shown · non-contrast
Comparison: none

CLINICAL DATA: Infertility

HYSTEROSALPINGOGRAM
TECHNIQUE: Hysterosalpingogram was performed by the ordering
physician under fluoroscopy.  Fluoroscopic images are submitted for
interpretation following the procedure.
Fluoroscopy Time:  1.2 minutes.

[Series 1: run · 1 of 1 slices shown (1 of 5)]
[im 1/1]
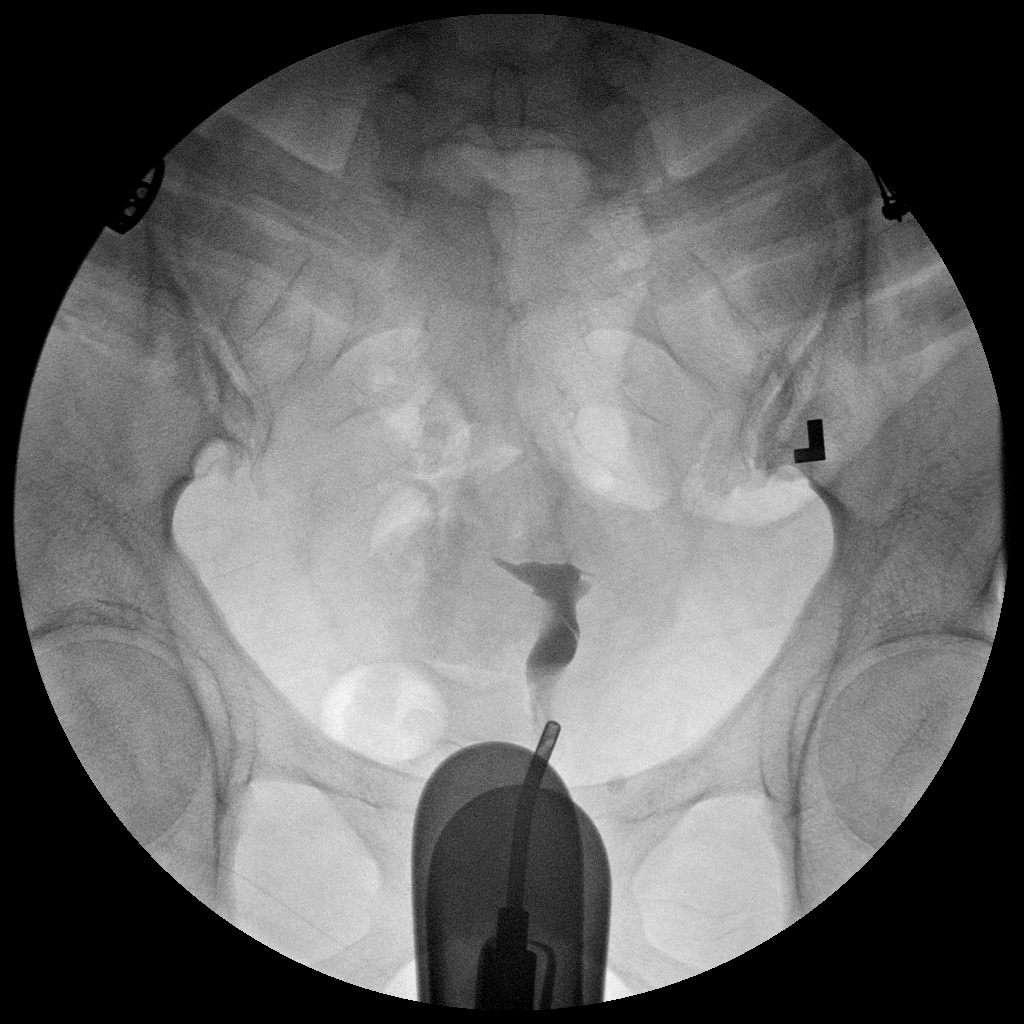

[Series 2: run · 1 of 1 slices shown (2 of 5)]
[im 1/1]
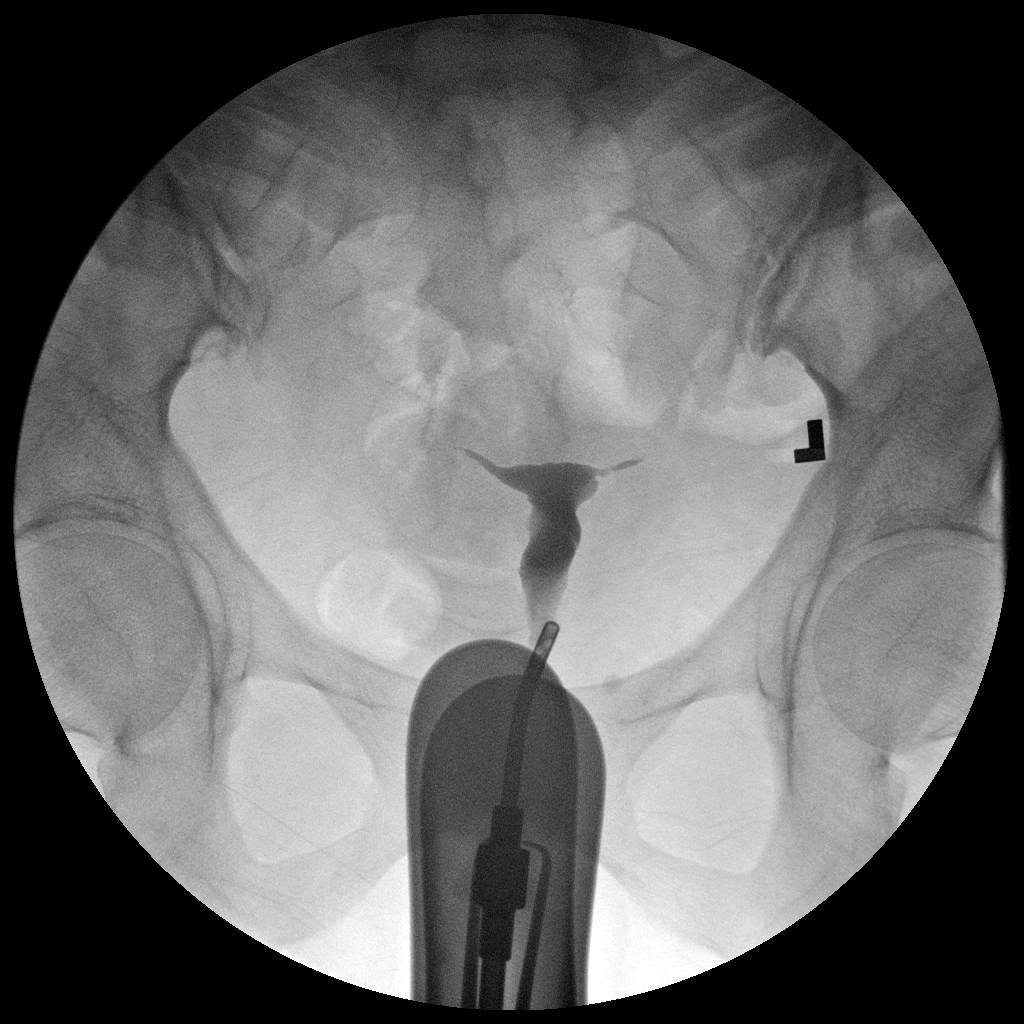

[Series 3: run · 1 of 1 slices shown (3 of 5)]
[im 1/1]
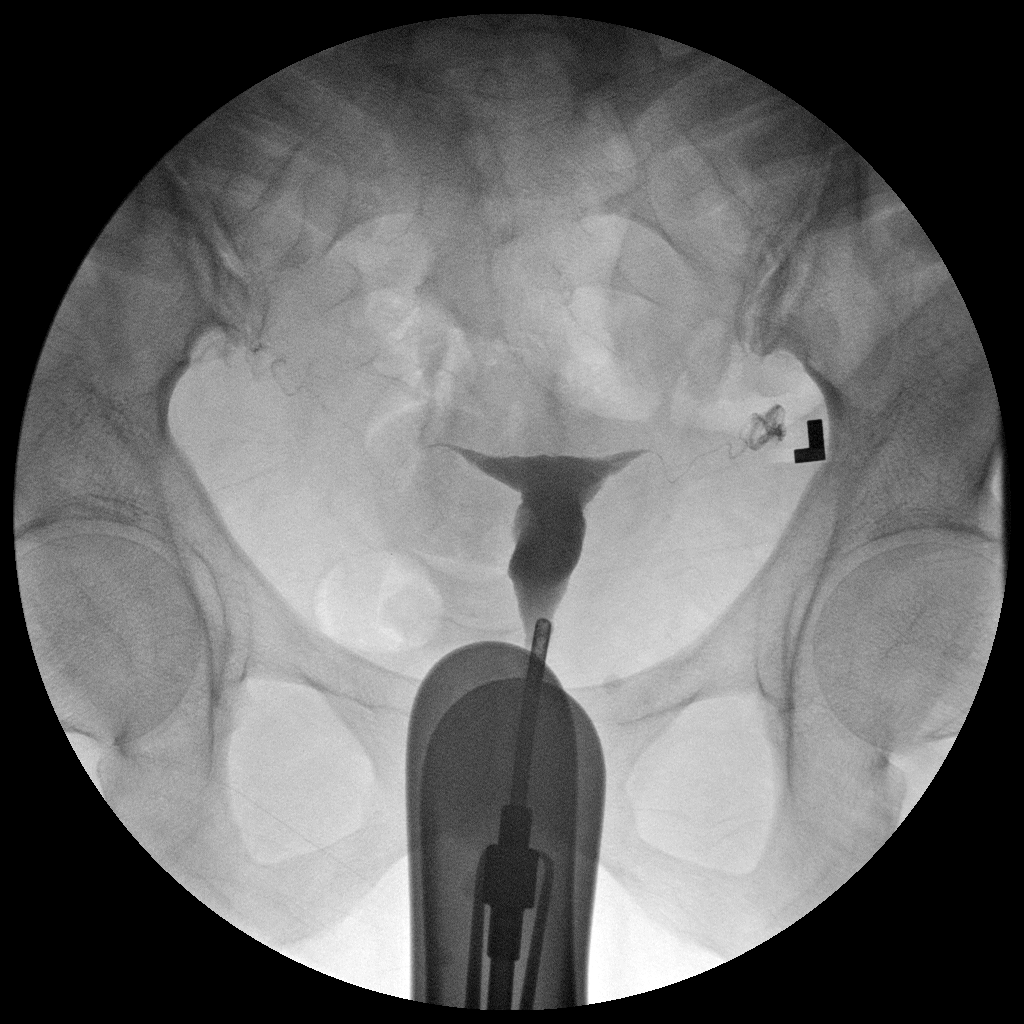

[Series 4: run · 1 of 1 slices shown (4 of 5)]
[im 1/1]
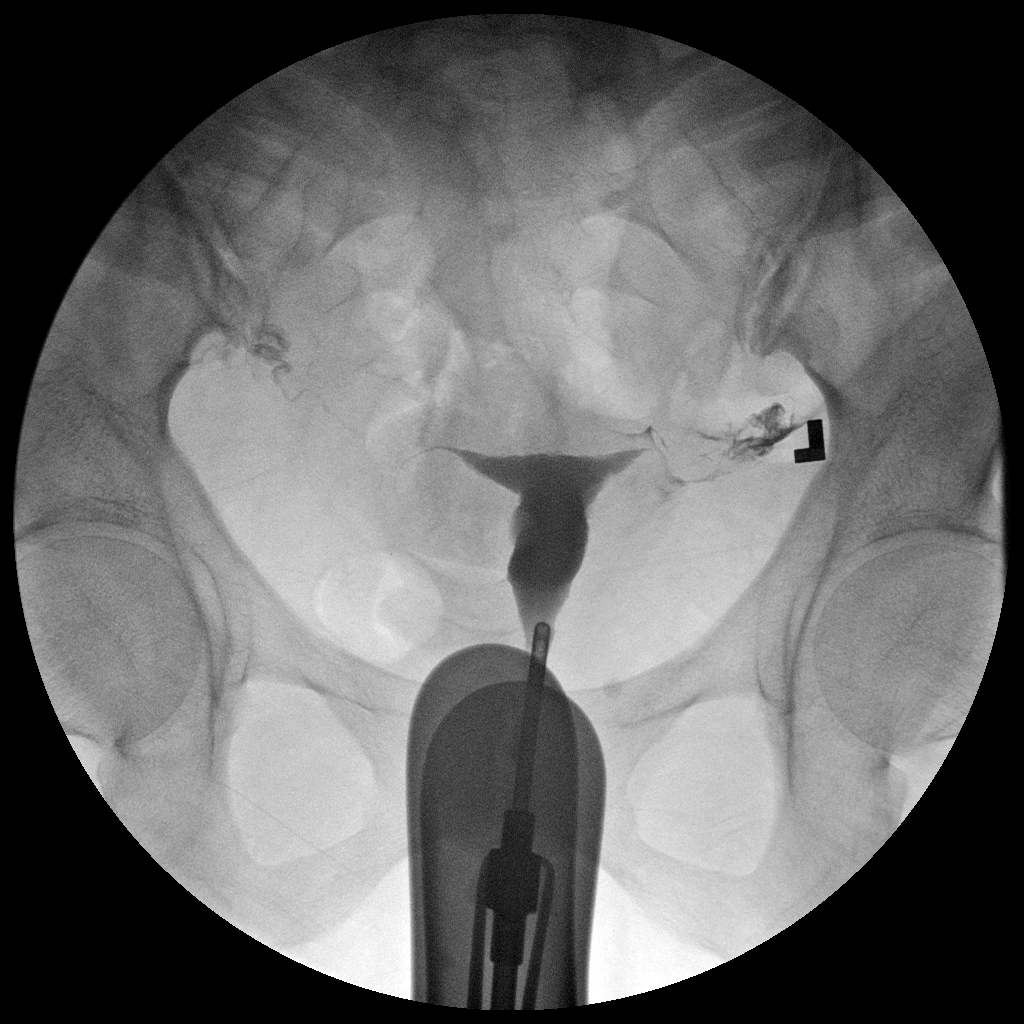

[Series 5: run · 1 of 1 slices shown (5 of 5)]
[im 1/1]
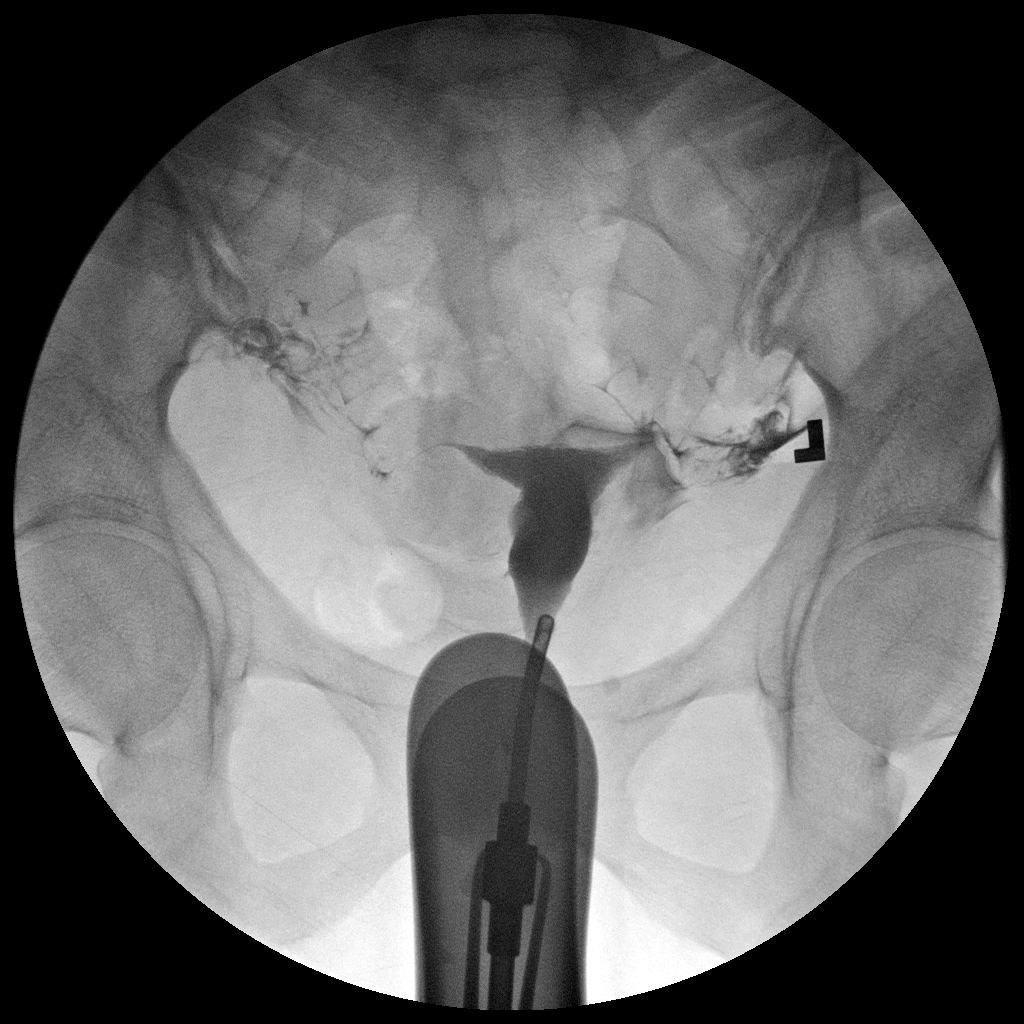

[5 of 5 positions shown; findings below may reference images not displayed]

FINDINGS: The endometrial cavity of the uterus is normal in contour
and appearance.

Contrast filling of both fallopian tubes is seen, and both tubes
are normal in appearance.  Intraperitoneal spill of the contrast
from both fallopian tubes is demonstrated.
IMPRESSION: Normal study.  Fallopian tubes are patent bilaterally.

## 2012-12-08 ENCOUNTER — Other Ambulatory Visit: Payer: Self-pay | Admitting: Obstetrics and Gynecology

## 2012-12-08 DIAGNOSIS — R928 Other abnormal and inconclusive findings on diagnostic imaging of breast: Secondary | ICD-10-CM

## 2012-12-27 ENCOUNTER — Ambulatory Visit
Admission: RE | Admit: 2012-12-27 | Discharge: 2012-12-27 | Disposition: A | Discharge status: Home / Self Care | Payer: BC Managed Care – PPO | Source: Ambulatory Visit | Attending: Obstetrics and Gynecology | Admitting: Obstetrics and Gynecology

## 2012-12-27 ENCOUNTER — Other Ambulatory Visit: Payer: Self-pay | Admitting: Obstetrics and Gynecology

## 2012-12-27 DIAGNOSIS — R928 Other abnormal and inconclusive findings on diagnostic imaging of breast: Secondary | ICD-10-CM

## 2013-06-27 ENCOUNTER — Other Ambulatory Visit: Payer: Self-pay | Admitting: Obstetrics and Gynecology

## 2013-06-27 DIAGNOSIS — N6310 Unspecified lump in the right breast, unspecified quadrant: Secondary | ICD-10-CM

## 2013-07-07 ENCOUNTER — Encounter (INDEPENDENT_AMBULATORY_CARE_PROVIDER_SITE_OTHER): Payer: Self-pay

## 2013-07-07 ENCOUNTER — Ambulatory Visit
Admission: RE | Admit: 2013-07-07 | Discharge: 2013-07-07 | Disposition: A | Discharge status: Home / Self Care | Payer: BC Managed Care – PPO | Source: Ambulatory Visit | Attending: Obstetrics and Gynecology | Admitting: Obstetrics and Gynecology

## 2013-07-07 DIAGNOSIS — N6310 Unspecified lump in the right breast, unspecified quadrant: Secondary | ICD-10-CM

## 2013-11-22 NOTE — Patient Instructions (Addendum)
   Your procedure is scheduled on:  Thursday, Oct 15  Enter through the [employer] [facility] at: 6 AM Pick up the phone at the desk and dial [phone] and inform us of your arrival.  Please call this number if you have any problems the morning of surgery: [phone]  Remember: Do not eat or drink after midnight: Wednesday Take these medicines the morning of surgery with a SIP OF WATER: valsartan  Do not wear jewelry, make-up, or FINGER nail polish No metal in your hair or on your body. Do not wear lotions, powders, perfumes.  You may wear deodorant.  Do not bring valuables to the hospital. Contacts, dentures or bridgework may not be worn into surgery.    Patients discharged on the day of surgery will not be allowed to drive home.  Home with boyfriend Aaron Edelman cell [phone].

## 2013-11-23 ENCOUNTER — Encounter (HOSPITAL_COMMUNITY): Payer: Self-pay | Admitting: Pharmacy Technician

## 2013-11-23 ENCOUNTER — Encounter (HOSPITAL_COMMUNITY): Payer: Self-pay

## 2013-11-23 ENCOUNTER — Encounter (HOSPITAL_COMMUNITY)
Admission: RE | Admit: 2013-11-23 | Discharge: 2013-11-23 | Disposition: A | Discharge status: Home / Self Care | Payer: BC Managed Care – PPO | Source: Ambulatory Visit | Attending: Obstetrics and Gynecology | Admitting: Obstetrics and Gynecology

## 2013-11-23 DIAGNOSIS — N920 Excessive and frequent menstruation with regular cycle: Secondary | ICD-10-CM | POA: Insufficient documentation

## 2013-11-23 DIAGNOSIS — Z0000 Encounter for general adult medical examination without abnormal findings: Secondary | ICD-10-CM | POA: Diagnosis present

## 2013-11-23 DIAGNOSIS — I10 Essential (primary) hypertension: Secondary | ICD-10-CM | POA: Diagnosis not present

## 2013-11-23 DIAGNOSIS — Z8541 Personal history of malignant neoplasm of cervix uteri: Secondary | ICD-10-CM | POA: Diagnosis not present

## 2013-11-23 DIAGNOSIS — H409 Unspecified glaucoma: Secondary | ICD-10-CM | POA: Diagnosis not present

## 2013-11-23 DIAGNOSIS — D259 Leiomyoma of uterus, unspecified: Secondary | ICD-10-CM | POA: Insufficient documentation

## 2013-11-23 HISTORY — DX: Unspecified glaucoma: H40.9

## 2013-11-23 HISTORY — DX: Benign neoplasm of connective and other soft tissue, unspecified: D21.9

## 2013-11-23 HISTORY — DX: Malignant (primary) neoplasm, unspecified: C80.1

## 2013-11-23 HISTORY — DX: Essential (primary) hypertension: I10

## 2013-11-23 NOTE — Pre-Procedure Instructions (Signed)
Patient brought copies of EKG and lab work.  Copies placed on chart.

## 2013-11-29 MED ORDER — DEXTROSE 5 % IV SOLN
2.0000 g | INTRAVENOUS | Status: AC
  Administered 2013-11-30: 2 g via INTRAVENOUS
  Filled 2013-11-29: qty 2

## 2013-11-29 NOTE — H&P (Addendum)
Rachel Davies is an 43 y.o. female G1P0 with heavy menses and submucus fibroid presents for surgical mngt      Past Medical History  Diagnosis Date  . Tachyarrhythmia     h/o - no probs now- no meds  . Hypertension   . Glaucoma     both eyes  . Fibroids   . Cancer     cervical tx with coniZation    Past Surgical History  Procedure Laterality Date  . Cervical conization w/bx  1996  . Wisdom tooth extraction    . Iud removal  01/02/2011    Procedure: INTRAUTERINE DEVICE (IUD) REMOVAL;  Surgeon: Marylynn Pearson;  Location: [facility];  Service: Gynecology;  Laterality: N/A;  . Dilatation & currettage/hysteroscopy with resectocope  12/30/2011    Procedure: [place];  Surgeon: Marylynn Pearson, MD;  Location: [facility];  Service: Gynecology;  Laterality: N/A;  resection uterine of adhesions resection of submucosal fibroid  . Dilation and curettage of uterus  2013x2,2012    x 3 surgeries    No family history on file.  Social History:  reports that she quit smoking about 3 years ago. Her smoking use included Cigarettes. She has a 10 pack-year smoking history. She has never used smokeless tobacco. She reports that she drinks alcohol. She reports that she does not use illicit drugs.  Allergies:  Allergies  Allergen Reactions  . Demerol Itching and Rash    No prescriptions prior to admission    ROS  Physical Exam AF, VSS BP 150s/160s/90s Gen - NAD CV - RRR Lungs - clear Abd - soft, NT/ND PV - uterus mobile, NT  [place]:  4.6cm submucus fibroid, no adnexal masses  Assessment/Plan: Menorrhagia Hysteroscopy, resection of submucus fibroid, endometrial ablation R/b/a discussed, questions answered, informed consent  Rachel Davies 11/29/2013, 2:33 PM

## 2013-11-30 ENCOUNTER — Ambulatory Visit (HOSPITAL_COMMUNITY): Payer: BC Managed Care – PPO | Admitting: Anesthesiology

## 2013-11-30 ENCOUNTER — Ambulatory Visit (HOSPITAL_COMMUNITY)
Admission: RE | Admit: 2013-11-30 | Discharge: 2013-11-30 | Disposition: A | Discharge status: Home / Self Care | Payer: BC Managed Care – PPO | Source: Ambulatory Visit | Attending: Obstetrics and Gynecology | Admitting: Obstetrics and Gynecology

## 2013-11-30 ENCOUNTER — Encounter (HOSPITAL_COMMUNITY): Payer: Self-pay | Admitting: Anesthesiology

## 2013-11-30 ENCOUNTER — Encounter (HOSPITAL_COMMUNITY)
Admission: RE | Disposition: A | Discharge status: Home / Self Care | Payer: Self-pay | Source: Ambulatory Visit | Attending: Obstetrics and Gynecology

## 2013-11-30 ENCOUNTER — Encounter (HOSPITAL_COMMUNITY): Payer: BC Managed Care – PPO | Admitting: Anesthesiology

## 2013-11-30 DIAGNOSIS — N920 Excessive and frequent menstruation with regular cycle: Secondary | ICD-10-CM | POA: Insufficient documentation

## 2013-11-30 DIAGNOSIS — Z87891 Personal history of nicotine dependence: Secondary | ICD-10-CM | POA: Diagnosis not present

## 2013-11-30 DIAGNOSIS — H409 Unspecified glaucoma: Secondary | ICD-10-CM | POA: Diagnosis not present

## 2013-11-30 DIAGNOSIS — I10 Essential (primary) hypertension: Secondary | ICD-10-CM | POA: Diagnosis not present

## 2013-11-30 DIAGNOSIS — Z885 Allergy status to narcotic agent status: Secondary | ICD-10-CM | POA: Diagnosis not present

## 2013-11-30 HISTORY — PX: DILITATION & CURRETTAGE/HYSTROSCOPY WITH NOVASURE ABLATION: SHX5568

## 2013-11-30 LAB — PREGNANCY, URINE: PREG TEST UR: NEGATIVE

## 2013-11-30 SURGERY — DILATATION & CURETTAGE/HYSTEROSCOPY WITH NOVASURE ABLATION
Anesthesia: General

## 2013-11-30 MED ORDER — KETOROLAC TROMETHAMINE 30 MG/ML IJ SOLN: 15.0000 mg | Freq: Once | INTRAMUSCULAR | Status: DC | PRN

## 2013-11-30 MED ORDER — MIDAZOLAM HCL 2 MG/2ML IJ SOLN
INTRAMUSCULAR | Status: DC | PRN
  Administered 2013-11-30: 2 mg via INTRAVENOUS

## 2013-11-30 MED ORDER — SCOPOLAMINE 1 MG/3DAYS TD PT72
1.0000 | MEDICATED_PATCH | Freq: Once | TRANSDERMAL | Status: DC
  Administered 2013-11-30: 1.5 mg via TRANSDERMAL

## 2013-11-30 MED ORDER — FAMOTIDINE IN NACL 20-0.9 MG/50ML-% IV SOLN
20.0000 mg | Freq: Once | INTRAVENOUS | Status: AC
Start: 2013-11-30 — End: 2013-11-30
  Administered 2013-11-30: 20 mg via INTRAVENOUS
  Filled 2013-11-30: qty 50

## 2013-11-30 MED ORDER — LIDOCAINE HCL (CARDIAC) 20 MG/ML IV SOLN
INTRAVENOUS | Status: AC
  Filled 2013-11-30: qty 5

## 2013-11-30 MED ORDER — DEXAMETHASONE SODIUM PHOSPHATE 10 MG/ML IJ SOLN
INTRAMUSCULAR | Status: DC | PRN
  Administered 2013-11-30: 4 mg via INTRAVENOUS

## 2013-11-30 MED ORDER — PHENYLEPHRINE HCL 10 MG/ML IJ SOLN
INTRAMUSCULAR | Status: DC | PRN
  Administered 2013-11-30 (×2): 40 ug via INTRAVENOUS

## 2013-11-30 MED ORDER — PHENYLEPHRINE 40 MCG/ML (10ML) SYRINGE FOR IV PUSH (FOR BLOOD PRESSURE SUPPORT)
PREFILLED_SYRINGE | INTRAVENOUS | Status: AC
  Filled 2013-11-30: qty 5

## 2013-11-30 MED ORDER — EPHEDRINE SULFATE 50 MG/ML IJ SOLN
INTRAMUSCULAR | Status: DC | PRN
  Administered 2013-11-30: 5 mg via INTRAVENOUS

## 2013-11-30 MED ORDER — LIDOCAINE HCL 1 % IJ SOLN
INTRAMUSCULAR | Status: AC
  Filled 2013-11-30: qty 20

## 2013-11-30 MED ORDER — SCOPOLAMINE 1 MG/3DAYS TD PT72
MEDICATED_PATCH | TRANSDERMAL | Status: AC
  Administered 2013-11-30: 07:00:00 1.5 mg via TRANSDERMAL
  Filled 2013-11-30: qty 1

## 2013-11-30 MED ORDER — LACTATED RINGERS IV SOLN
INTRAVENOUS | Status: DC
  Administered 2013-11-30 (×2): via INTRAVENOUS

## 2013-11-30 MED ORDER — LIDOCAINE HCL 1 % IJ SOLN
INTRAMUSCULAR | Status: DC | PRN
  Administered 2013-11-30: 10 mL

## 2013-11-30 MED ORDER — MEPERIDINE HCL 25 MG/ML IJ SOLN: 6.2500 mg | INTRAMUSCULAR | Status: DC | PRN

## 2013-11-30 MED ORDER — FENTANYL CITRATE 0.05 MG/ML IJ SOLN
25.0000 ug | INTRAMUSCULAR | Status: DC | PRN
  Administered 2013-11-30 (×2): 50 ug via INTRAVENOUS

## 2013-11-30 MED ORDER — PROMETHAZINE HCL 25 MG/ML IJ SOLN: 6.2500 mg | INTRAMUSCULAR | Status: DC | PRN

## 2013-11-30 MED ORDER — KETOROLAC TROMETHAMINE 30 MG/ML IJ SOLN
INTRAMUSCULAR | Status: DC | PRN
  Administered 2013-11-30: 30 mg via INTRAVENOUS

## 2013-11-30 MED ORDER — FENTANYL CITRATE 0.05 MG/ML IJ SOLN
INTRAMUSCULAR | Status: AC
  Filled 2013-11-30: qty 5

## 2013-11-30 MED ORDER — KETOROLAC TROMETHAMINE 30 MG/ML IJ SOLN
INTRAMUSCULAR | Status: AC
  Filled 2013-11-30: qty 1

## 2013-11-30 MED ORDER — EPHEDRINE 5 MG/ML INJ
INTRAVENOUS | Status: AC
  Filled 2013-11-30: qty 10

## 2013-11-30 MED ORDER — DEXAMETHASONE SODIUM PHOSPHATE 4 MG/ML IJ SOLN
INTRAMUSCULAR | Status: AC
  Filled 2013-11-30: qty 1

## 2013-11-30 MED ORDER — PROPOFOL 10 MG/ML IV BOLUS
INTRAVENOUS | Status: DC | PRN
  Administered 2013-11-30: 200 mg via INTRAVENOUS

## 2013-11-30 MED ORDER — PROPOFOL 10 MG/ML IV EMUL
INTRAVENOUS | Status: AC
  Filled 2013-11-30: qty 40

## 2013-11-30 MED ORDER — MIDAZOLAM HCL 2 MG/2ML IJ SOLN: 0.5000 mg | Freq: Once | INTRAMUSCULAR | Status: DC | PRN

## 2013-11-30 MED ORDER — LIDOCAINE HCL (CARDIAC) 20 MG/ML IV SOLN
INTRAVENOUS | Status: DC | PRN
  Administered 2013-11-30: 80 mg via INTRAVENOUS

## 2013-11-30 MED ORDER — ONDANSETRON HCL 4 MG/2ML IJ SOLN
INTRAMUSCULAR | Status: DC | PRN
  Administered 2013-11-30: 4 mg via INTRAVENOUS

## 2013-11-30 MED ORDER — ONDANSETRON HCL 4 MG/2ML IJ SOLN
INTRAMUSCULAR | Status: AC
  Filled 2013-11-30: qty 2

## 2013-11-30 MED ORDER — FENTANYL CITRATE 0.05 MG/ML IJ SOLN
INTRAMUSCULAR | Status: AC
  Administered 2013-11-30: 50 ug via INTRAVENOUS
  Filled 2013-11-30: qty 2

## 2013-11-30 MED ORDER — FENTANYL CITRATE 0.05 MG/ML IJ SOLN
INTRAMUSCULAR | Status: DC | PRN
  Administered 2013-11-30 (×3): 50 ug via INTRAVENOUS

## 2013-11-30 MED ORDER — OXYCODONE-ACETAMINOPHEN 5-325 MG PO TABS: 1.0000 | ORAL_TABLET | ORAL | Status: AC | PRN

## 2013-11-30 MED ORDER — CITRIC ACID-SODIUM CITRATE 334-500 MG/5ML PO SOLN
ORAL | Status: DC | PRN
  Administered 2013-11-30: 30 mL via ORAL

## 2013-11-30 MED ORDER — MIDAZOLAM HCL 2 MG/2ML IJ SOLN
INTRAMUSCULAR | Status: AC
  Filled 2013-11-30: qty 2

## 2013-11-30 SURGICAL SUPPLY — 15 items
ABLATOR ENDOMETRIAL BIPOLAR (ABLATOR) ×2 IMPLANT
CATH ROBINSON RED A/P 16FR (CATHETERS) ×2 IMPLANT
CLOTH BEACON ORANGE TIMEOUT ST (SAFETY) ×2 IMPLANT
CONTAINER PREFILL 10% NBF 60ML (FORM) ×2 IMPLANT
DRAPE HYSTEROSCOPY (DRAPE) ×2 IMPLANT
GLOVE BIO SURGEON STRL SZ 6.5 (GLOVE) ×2 IMPLANT
GLOVE BIOGEL PI IND STRL 7.0 (GLOVE) ×1 IMPLANT
GLOVE BIOGEL PI INDICATOR 7.0 (GLOVE) ×1
GOWN STRL REUS W/TWL LRG LVL3 (GOWN DISPOSABLE) ×4 IMPLANT
PACK VAGINAL MINOR WOMEN LF (CUSTOM PROCEDURE TRAY) ×2 IMPLANT
PAD OB MATERNITY 4.3X12.25 (PERSONAL CARE ITEMS) ×2 IMPLANT
SET TUBING HYSTEROSCOPY 2 NDL (TUBING) ×2 IMPLANT
TOWEL OR 17X24 6PK STRL BLUE (TOWEL DISPOSABLE) ×4 IMPLANT
TUBE HYSTEROSCOPY W Y-CONNECT (TUBING) ×2 IMPLANT
WATER STERILE IRR 1000ML POUR (IV SOLUTION) ×2 IMPLANT

## 2013-11-30 NOTE — Discharge Instructions (Signed)
°  NO IBUPROFEN CONTAINING PRODUCTS (ie Advil, Aleve, Motrin, etc.) UNTIL AFTER 2:15 pm TODAY.   DISCHARGE INSTRUCTIONS: HYSTEROSCOPY / ENDOMETRIAL ABLATION The following instructions have been prepared to help you care for yourself upon your return home.  Personal hygiene:  Use sanitary pads for vaginal drainage, not tampons.  Shower the day after your procedure.  NO tub baths, pools or Jacuzzis for 2-3 weeks.  Wipe front to back after using the bathroom.  Activity and limitations:  Do NOT drive or operate any equipment for 24 hours. The effects of anesthesia are still present and drowsiness may result.  Do NOT rest in bed all day.  Walking is encouraged.  Walk up and down stairs slowly.  You may resume your normal activity in one to two days or as indicated by your physician. Sexual activity: NO intercourse for at least 2 weeks after the procedure, or as indicated by your Doctor.  Diet: Eat a light meal as desired this evening. You may resume your usual diet tomorrow.  Return to Work: You may resume your work activities in one to two days or as indicated by Marine scientist.  What to expect after your surgery: Expect to have vaginal bleeding/discharge for 2-3 days and spotting for up to 10 days. It is not unusual to have soreness for up to 1-2 weeks. You may have a slight burning sensation when you urinate for the first day. Mild cramps may continue for a couple of days. You may have a regular period in 2-6 weeks.  Call your doctor for any of the following:  Excessive vaginal bleeding or clotting, saturating and changing one pad every hour.  Inability to urinate 6 hours after discharge from hospital.  Pain not relieved by pain medication.  Fever of 100.4 F or greater.  Unusual vaginal discharge or odor.  Return to office _________________Call for an appointment ___________________ Patients signature: ______________________ Nurses signature  ________________________  [facility] [phone]

## 2013-11-30 NOTE — Transfer of Care (Signed)
Immediate Anesthesia Transfer of Care Note  Patient: Rachel Davies  Procedure(s) Performed: Procedure(s): DILATATION & CURETTAGE/HYSTEROSCOPY WITH NOVASURE ABLATION, MYOSURE (N/A)  Patient Location: PACU  Anesthesia Type:General  Level of Consciousness: awake, alert , oriented and patient cooperative  Airway & Oxygen Therapy: Patient Spontanous Breathing and Patient connected to nasal cannula oxygen  Post-op Assessment: Report given to PACU RN and Post -op Vital signs reviewed and stable  Post vital signs: Reviewed and stable  Complications: No apparent anesthesia complications

## 2013-11-30 NOTE — Anesthesia Preprocedure Evaluation (Signed)
Anesthesia Evaluation  Patient identified by MRN, date of birth, ID band Patient awake    Reviewed: Allergy & Precautions, H&P , Patient's Chart, lab work & pertinent test results, reviewed documented beta blocker date and time   History of Anesthesia Complications Negative for: history of anesthetic complications  Airway Mallampati: II TM Distance: >3 FB Neck ROM: full    Dental   Pulmonary former smoker,  breath sounds clear to auscultation        Cardiovascular Exercise Tolerance: Good hypertension, Rhythm:regular Rate:Normal     Neuro/Psych negative psych ROS   GI/Hepatic   Endo/Other    Renal/GU      Musculoskeletal   Abdominal   Peds  Hematology   Anesthesia Other Findings gloucaoma  Reproductive/Obstetrics                           Anesthesia Physical Anesthesia Plan  ASA: II  Anesthesia Plan: General LMA   Post-op Pain Management:    Induction:   Airway Management Planned:   Additional Equipment:   Intra-op Plan:   Post-operative Plan:   Informed Consent: I have reviewed the patients History and Physical, chart, labs and discussed the procedure including the risks, benefits and alternatives for the proposed anesthesia with the patient or authorized representative who has indicated his/her understanding and acceptance.   Dental Advisory Given  Plan Discussed with: CRNA, Surgeon and Anesthesiologist  Anesthesia Plan Comments:         Anesthesia Quick Evaluation

## 2013-11-30 NOTE — Op Note (Signed)
NAMEJAZZMON, Rachel Davies                ACCOUNT NO.:  [id number]  MEDICAL RECORD NO.:  [id number]  LOCATION:  [facility]                          FACILITY:  [facility]  PHYSICIAN:  Marylynn Pearson, MD    DATE OF BIRTH:  03/20/1970  DATE OF PROCEDURE:  11/30/2013 DATE OF DISCHARGE:                              OPERATIVE REPORT   PREOPERATIVE DIAGNOSIS:  Menorrhagia.  POSTOPERATIVE DIAGNOSIS:  Menorrhagia.  PROCEDURE:  Paracervical block, Hysteroscopy D and C, with NovaSure endometrial ablation.  SURGEON:  Marylynn Pearson, MD  ANESTHESIA:  General.  COMPLICATIONS:  None.  SPECIMEN:  Endometrial curettings.  CONDITION:  Stable to recovery room.  PROCEDURE:  The patient was taken to the operating room, where she was placed in dorsal lithotomy position using Allen stirrups.  She was given general anesthesia and prepped and draped in sterile fashion.  In- and out catheter was used to drain her bladder.  Bivalve speculum was placed in the vagina and 1% lidocaine was used to perform a paracervical block. Single-tooth tenaculum was attached to the anterior lip of the cervix. The cervix was sounded using Pratt dilators and the hysteroscope was inserted.  Bilateral tubal ostia were visualized and found to be appeared normal.  There were no intracavitary masses identified. Hysteroscope was removed and a gentle curetting was then performed throughout the cavity.  Hysteroscope was reinserted and again no polypoid or fibroid masses were identified.  Hysteroscope was removed and the NovaSure device was inserted.  NovaSure procedure was performed using standard guidelines once the procedure was completed.  The device was allowed to cool and then removed from the cavity.  Tenaculum was removed from the cervix.  The cervix was hemostatic.  The speculum was removed.  The sponge, lap, needle, and instrument counts were correct x2.     Marylynn Pearson, MD     GA/MEDQ  D:  11/30/2013  T:  11/30/2013   Job:  [id number]

## 2013-11-30 NOTE — Anesthesia Postprocedure Evaluation (Signed)
  Anesthesia Post Note  Patient: Rachel Davies  Procedure(s) Performed: Procedure(s) (LRB): DILATATION & CURETTAGE/HYSTEROSCOPY WITH NOVASURE ABLATION, MYOSURE (N/A)  Anesthesia type: GA  Patient location: PACU  Post pain: Pain level controlled  Post assessment: Post-op Vital signs reviewed  Last Vitals:  Filed Vitals:   11/30/13 0820  BP: 114/66  Pulse:   Temp: 36.3 C  Resp: 15    Post vital signs: Reviewed  Level of consciousness: sedated  Complications: No apparent anesthesia complications

## 2013-12-01 ENCOUNTER — Encounter (HOSPITAL_COMMUNITY): Payer: Self-pay | Admitting: Obstetrics and Gynecology

## 2014-02-28 ENCOUNTER — Other Ambulatory Visit: Payer: Self-pay | Admitting: Obstetrics and Gynecology

## 2014-03-01 LAB — CYTOLOGY - PAP

## 2016-02-20 ENCOUNTER — Other Ambulatory Visit: Payer: Self-pay | Admitting: Internal Medicine

## 2016-02-20 ENCOUNTER — Other Ambulatory Visit: Payer: Self-pay | Admitting: Obstetrics and Gynecology

## 2016-02-20 DIAGNOSIS — N6310 Unspecified lump in the right breast, unspecified quadrant: Secondary | ICD-10-CM

## 2016-02-28 ENCOUNTER — Other Ambulatory Visit: Payer: Self-pay | Admitting: Internal Medicine

## 2016-02-28 ENCOUNTER — Ambulatory Visit
Admission: RE | Admit: 2016-02-28 | Discharge: 2016-02-28 | Disposition: A | Discharge status: Home / Self Care | Payer: BLUE CROSS/BLUE SHIELD | Source: Ambulatory Visit | Attending: Internal Medicine | Admitting: Internal Medicine

## 2016-02-28 DIAGNOSIS — N6320 Unspecified lump in the left breast, unspecified quadrant: Secondary | ICD-10-CM

## 2016-02-28 DIAGNOSIS — N6310 Unspecified lump in the right breast, unspecified quadrant: Secondary | ICD-10-CM

## 2016-03-06 ENCOUNTER — Ambulatory Visit
Admission: RE | Admit: 2016-03-06 | Discharge: 2016-03-06 | Disposition: A | Discharge status: Home / Self Care | Payer: BLUE CROSS/BLUE SHIELD | Source: Ambulatory Visit | Attending: Internal Medicine | Admitting: Internal Medicine

## 2016-03-06 ENCOUNTER — Other Ambulatory Visit: Payer: Self-pay | Admitting: Internal Medicine

## 2016-03-06 DIAGNOSIS — N6320 Unspecified lump in the left breast, unspecified quadrant: Secondary | ICD-10-CM

## 2018-06-27 ENCOUNTER — Other Ambulatory Visit: Payer: Self-pay | Admitting: Internal Medicine

## 2018-06-27 DIAGNOSIS — N6489 Other specified disorders of breast: Secondary | ICD-10-CM

## 2018-07-15 ENCOUNTER — Other Ambulatory Visit: Payer: Self-pay

## 2018-07-15 ENCOUNTER — Ambulatory Visit: Payer: BLUE CROSS/BLUE SHIELD

## 2018-07-15 ENCOUNTER — Ambulatory Visit
Admission: RE | Admit: 2018-07-15 | Discharge: 2018-07-15 | Disposition: A | Discharge status: Home / Self Care | Payer: BLUE CROSS/BLUE SHIELD | Source: Ambulatory Visit | Attending: Internal Medicine | Admitting: Internal Medicine

## 2018-07-15 DIAGNOSIS — N6489 Other specified disorders of breast: Secondary | ICD-10-CM

## 2022-07-18 DEATH — deceased
